# Patient Record
Sex: Male | Born: 1990 | Race: Black or African American | Hispanic: No | Marital: Single | Smoking: Never smoker
Health system: Southern US, Community
[De-identification: ages and names within clinical notes are randomized; demographics above are authoritative.]

## PROBLEM LIST (undated history)

## (undated) DIAGNOSIS — J45909 Unspecified asthma, uncomplicated: Secondary | ICD-10-CM

---

## 2013-03-20 ENCOUNTER — Emergency Department (HOSPITAL_COMMUNITY)
Admission: EM | Admit: 2013-03-20 | Discharge: 2013-03-20 | Disposition: A | Discharge status: Home / Self Care | Payer: Self-pay | Attending: Emergency Medicine | Admitting: Emergency Medicine

## 2013-03-20 ENCOUNTER — Emergency Department (HOSPITAL_COMMUNITY)
Admission: EM | Admit: 2013-03-20 | Discharge: 2013-03-20 | Disposition: A | Discharge status: Home / Self Care | Payer: Self-pay

## 2013-03-20 DIAGNOSIS — J45901 Unspecified asthma with (acute) exacerbation: Secondary | ICD-10-CM

## 2013-03-20 MED ORDER — ALBUTEROL SULFATE (5 MG/ML) 0.5% IN NEBU
5.0000 mg | INHALATION_SOLUTION | Freq: Once | RESPIRATORY_TRACT | Status: AC
  Administered 2013-03-20: 5 mg via RESPIRATORY_TRACT
  Filled 2013-03-20: qty 1

## 2013-03-20 MED ORDER — ALBUTEROL SULFATE (5 MG/ML) 0.5% IN NEBU
5.0000 mg | INHALATION_SOLUTION | Freq: Once | RESPIRATORY_TRACT | Status: AC
  Administered 2013-03-20: 5 mg via RESPIRATORY_TRACT

## 2013-03-20 MED ORDER — ALBUTEROL SULFATE (5 MG/ML) 0.5% IN NEBU
INHALATION_SOLUTION | RESPIRATORY_TRACT | Status: AC
  Filled 2013-03-20: qty 1

## 2013-03-20 MED ORDER — PREDNISONE 20 MG PO TABS: ORAL_TABLET | ORAL | Status: DC

## 2013-03-20 MED ORDER — PREDNISONE 20 MG PO TABS
60.0000 mg | ORAL_TABLET | Freq: Once | ORAL | Status: AC
  Administered 2013-03-20: 60 mg via ORAL
  Filled 2013-03-20: qty 3

## 2013-03-20 MED ORDER — ALBUTEROL SULFATE HFA 108 (90 BASE) MCG/ACT IN AERS: 2.0000 | INHALATION_SPRAY | RESPIRATORY_TRACT | Status: AC | PRN

## 2013-03-20 MED ORDER — IPRATROPIUM BROMIDE 0.02 % IN SOLN
0.5000 mg | Freq: Once | RESPIRATORY_TRACT | Status: AC
  Administered 2013-03-20: 0.5 mg via RESPIRATORY_TRACT
  Filled 2013-03-20: qty 2.5

## 2013-03-20 NOTE — ED Provider Notes (Signed)
CSN: [id number]     Arrival date & time 03/20/13  1844 History   First MD Initiated Contact with Patient 03/20/13 1848     Chief Complaint  Patient presents with  . Shortness of Breath   (Consider location/radiation/quality/duration/timing/severity/associated sxs/prior Treatment) HPI Comments: Patient is a 22 year old male with remote history of asthma who presents today with sudden onset shortness of breath. He reports that he has chest tightness. The pain is worse when he takes a deep breath in. He has not had an asthma attack since he was 22 years old, but this feels like that. He has no medications that he was able to use prior to arrival. He recently got back from [place] in late November. He has had a mild, nonproductive cough over the past few days, but states that it was nothing bad. No fever, chills, nausea, vomiting, abdominal pain, prior PE or DVT.  The history is provided by the patient. No language interpreter was used.    No past medical history on file. No past surgical history on file. No family history on file. History  Substance Use Topics  . Smoking status: Not on file  . Smokeless tobacco: Not on file  . Alcohol Use: Not on file    Review of Systems  Constitutional: Negative for fever and chills.  Respiratory: Positive for cough, chest tightness and shortness of breath.   Cardiovascular: Negative for leg swelling.  Gastrointestinal: Negative for nausea, vomiting and abdominal pain.  All other systems reviewed and are negative.    Allergies  Review of patient's allergies indicates no known allergies.  Home Medications  No current outpatient prescriptions on file. BP 133/91  Pulse 101  Temp(Src) 98.3 F (36.8 C) (Oral)  Resp 18  SpO2 95% Physical Exam  Nursing note and vitals reviewed. Constitutional: He is oriented to person, place, and time. He appears well-developed and well-nourished. No distress.  HENT:  Head: Normocephalic and atraumatic.  Right  Ear: External ear normal.  Left Ear: External ear normal.  Nose: Nose normal.  Eyes: Conjunctivae are normal.  Neck: Normal range of motion. No tracheal deviation present.  Cardiovascular: Normal rate, regular rhythm and normal heart sounds.   Pulmonary/Chest: Effort normal. No stridor. No respiratory distress. He has wheezes. He has no rhonchi. He has no rales.  Abdominal: Soft. He exhibits no distension. There is no tenderness.  Musculoskeletal: Normal range of motion.  Neurological: He is alert and oriented to person, place, and time.  Skin: Skin is warm and dry. He is not diaphoretic.  Psychiatric: He has a normal mood and affect. His behavior is normal.    ED Course  Procedures (including critical care time) Labs Review Labs Reviewed - No data to display Imaging Review No results found.  EKG Interpretation   None       MDM   1. Asthma attack    Patient ambulated in ED with O2 saturations maintained >90, no current signs of respiratory distress. Lung exam improved after nebulizer treatment. Prednisone given in the ED and pt will bd dc with 5 day burst. Pt states they are breathing at baseline. Pt has been instructed to continue using prescribed medications and to speak with PCP about today's exacerbation. Dr. Lynelle Doctor evaluated this patient and agrees with plan.     Mora Bellman, PA-C 03/20/13 [phone]

## 2013-03-20 NOTE — ED Notes (Signed)
Patient states that today he suddenly became hot and began to have chest tightness and shortness of breath. States he was recently discharged from the [employer] and has not had asthma attack since childhood, but the symptoms today are similar.

## 2013-03-21 NOTE — ED Provider Notes (Signed)
Medical screening examination/treatment/procedure(s) were performed by non-physician practitioner and as supervising physician I was immediately available for consultation/collaboration.    Celene Kras, MD 03/21/13 [phone]

## 2013-03-28 ENCOUNTER — Emergency Department (HOSPITAL_COMMUNITY)
Admission: EM | Admit: 2013-03-28 | Discharge: 2013-03-28 | Disposition: A | Discharge status: Home / Self Care | Payer: Self-pay | Attending: Emergency Medicine | Admitting: Emergency Medicine

## 2013-03-28 ENCOUNTER — Encounter (HOSPITAL_COMMUNITY): Payer: Self-pay | Admitting: Emergency Medicine

## 2013-03-28 ENCOUNTER — Emergency Department (HOSPITAL_COMMUNITY): Payer: Self-pay

## 2013-03-28 DIAGNOSIS — IMO0002 Reserved for concepts with insufficient information to code with codable children: Secondary | ICD-10-CM | POA: Insufficient documentation

## 2013-03-28 DIAGNOSIS — Z79899 Other long term (current) drug therapy: Secondary | ICD-10-CM | POA: Insufficient documentation

## 2013-03-28 DIAGNOSIS — R0789 Other chest pain: Secondary | ICD-10-CM | POA: Insufficient documentation

## 2013-03-28 DIAGNOSIS — J45901 Unspecified asthma with (acute) exacerbation: Secondary | ICD-10-CM | POA: Insufficient documentation

## 2013-03-28 HISTORY — DX: Unspecified asthma, uncomplicated: J45.909

## 2013-03-28 MED ORDER — PREDNISONE 20 MG PO TABS: ORAL_TABLET | ORAL | Status: DC

## 2013-03-28 MED ORDER — IPRATROPIUM BROMIDE 0.02 % IN SOLN
0.5000 mg | Freq: Once | RESPIRATORY_TRACT | Status: AC
  Administered 2013-03-28: 0.5 mg via RESPIRATORY_TRACT

## 2013-03-28 MED ORDER — PREDNISONE 20 MG PO TABS
60.0000 mg | ORAL_TABLET | Freq: Once | ORAL | Status: AC
  Administered 2013-03-28: 60 mg via ORAL
  Filled 2013-03-28: qty 3

## 2013-03-28 MED ORDER — ALBUTEROL SULFATE (5 MG/ML) 0.5% IN NEBU
2.5000 mg | INHALATION_SOLUTION | RESPIRATORY_TRACT | Status: DC | PRN
  Administered 2013-03-28: 2.5 mg via RESPIRATORY_TRACT
  Filled 2013-03-28: qty 0.5

## 2013-03-28 MED ORDER — ALBUTEROL SULFATE (5 MG/ML) 0.5% IN NEBU
2.5000 mg | INHALATION_SOLUTION | Freq: Once | RESPIRATORY_TRACT | Status: AC
  Administered 2013-03-28: 2.5 mg via RESPIRATORY_TRACT

## 2013-03-28 MED ORDER — ALBUTEROL SULFATE HFA 108 (90 BASE) MCG/ACT IN AERS: 2.0000 | INHALATION_SPRAY | RESPIRATORY_TRACT | Status: DC | PRN

## 2013-03-28 MED ORDER — IPRATROPIUM BROMIDE 0.02 % IN SOLN
0.5000 mg | RESPIRATORY_TRACT | Status: DC | PRN
  Administered 2013-03-28: 0.5 mg via RESPIRATORY_TRACT
  Filled 2013-03-28: qty 2.5

## 2013-03-28 NOTE — Progress Notes (Signed)
   CARE MANAGEMENT ED NOTE 03/28/2013  Patient:  Geoffrey Martinez, Geoffrey Martinez   Account Number:  [id number]  Date Initiated:  03/28/2013  Documentation initiated by:  Radford Pax  Subjective/Objective Assessment:   Patient presenst to Ed with asthma attack.  Paktient with audible wheezing.     Subjective/Objective Assessment Detail:   Patientgiven nebulizer in the ED.     Action/Plan:   Action/Plan Detail:   Anticipated DC Date:       Status Recommendation to Physician:   Result of Recommendation:    Other ED Services  Consult Working Plan    DC Planning Services  CM consult  Medication Assistance  MATCH Program  PCP issues  Other    Choice offered to / List presented to:            Status of service:  Completed, signed off  ED Comments:   ED Comments Detail:  EDCM spoke to aptient at bedside.  Patient reports he is homeless.  He says the address in the computer is his cousin's and admits to staying with his cousin but says that his cousin is threatening him to get out.  Patient is unemployed.  [facility] consulted for medication assistance. Patient without insurance.  MATCH program initiated. Patient to be discharged on prednisone and albuterol inhaler.  [facility] asked patient if he can afford six dollars. Patient replied that he could afford six dollars for his prescriptions.  [facility] provided patient with MATCH letter to provide to any of the listed [facility] participating pharmacies.  Explained to patient that he only has seven days to get RX filled.  Patient reports he was getting it filled tonight.  EDCM also explained to patient that [facility] would only be able to help him this once and would not be eligible for assistance for another year.  EDCM explained to patient that he needs to find a pcp.  EDCM offered to make patient an appointment at [facility] and patient agreed.  Also provided patient information to inquire about the orange card.  [facility] provided patient with a list of  pcps who acept self pay patients, RX discount card, list of discounted pharmacies, list of financial assistance in the community sucha s local churches and [employer], list of local shelters including information on the [facility] an [employer]. Patient thankful for information.  No further CM needs at this time.

## 2013-03-28 NOTE — ED Provider Notes (Signed)
CSN: [id number]     Arrival date & time 03/28/13  1805 History   First MD Initiated Contact with Patient 03/28/13 2041     Chief Complaint  Patient presents with  . Asthma Exacerbation    (Consider location/radiation/quality/duration/timing/severity/associated sxs/prior Treatment) HPI Comments: 22 yo male presents with recurrent "asthma attack". He states he was driving and immediately felt an onset of shortness of breath, wheezing, and cough. He also had associated chest tightness. This occurred about 4 hours ago. He went to the ER one is in the later received a DuoNeb which seemed to alleviate some of his symptoms but he still having some shortness of breath. Patient states that he had similar episode 8 days ago and was seen here and treated. He was given prednisone but did not fill the prescription due to money issues. He recently returned from [place] where he was in the [employer] about one month ago. He had asthma as a child has not had any symptoms since returning. Denies a fevers, chills, rhinorrhea, or productive cough. No hemoptysis.   Past Medical History  Diagnosis Date  . Asthma    No past surgical history on file. No family history on file. History  Substance Use Topics  . Smoking status: Not on file  . Smokeless tobacco: Not on file  . Alcohol Use: Not on file    Review of Systems  Constitutional: Negative for fever and chills.  Respiratory: Positive for cough, chest tightness, shortness of breath and wheezing.   Cardiovascular: Negative for leg swelling.  Gastrointestinal: Negative for vomiting.  All other systems reviewed and are negative.    Allergies  Review of patient's allergies indicates no known allergies.  Home Medications   Current Outpatient Rx  Name  Route  Sig  Dispense  Refill  . albuterol (PROVENTIL HFA;VENTOLIN HFA) 108 (90 BASE) MCG/ACT inhaler   Inhalation   Inhale 2 puffs into the lungs every 4 (four) hours as needed for wheezing or shortness  of breath.   1 Inhaler   3   . predniSONE (DELTASONE) 20 MG tablet      3 tabs po day one, then 2 po daily x 4 days   11 tablet   0    BP 129/76  Pulse 106  Resp 20  SpO2 98% Physical Exam  Nursing note and vitals reviewed. Constitutional: He is oriented to person, place, and time. He appears well-developed and well-nourished. No distress.  HENT:  Head: Normocephalic and atraumatic.  Right Ear: External ear normal.  Left Ear: External ear normal.  Nose: Nose normal.  Eyes: Right eye exhibits no discharge. Left eye exhibits no discharge.  Neck: Neck supple.  Cardiovascular: Normal rate, regular rhythm, normal heart sounds and intact distal pulses.   Pulmonary/Chest: Effort normal. No respiratory distress. He has wheezes.  Abdominal: Soft. There is no tenderness.  Musculoskeletal: He exhibits no edema.  Neurological: He is alert and oriented to person, place, and time.  Skin: Skin is warm and dry.    ED Course  Procedures (including critical care time) Labs Review Labs Reviewed - No data to display Imaging Review Dg Chest 2 View  03/28/2013   CLINICAL DATA:  Shortness of breath, chest tightness  EXAM: CHEST  2 VIEW  COMPARISON:  None.  FINDINGS: Normal cardiac silhouette and mediastinal contours. The lungs appear mildly hyperexpanded with mild diffuse slightly nodular thickening of the pulmonary interstitium. There is minimal pleural parenchymal thickening above the bilateral major fissures. No focal airspace  opacities. No pleural effusion pneumothorax. No evidence of edema. No acute osseus abnormalities.  IMPRESSION: Findings suggestive of airways disease / bronchitis. No focal airspace opacities to suggest pneumonia.   Electronically Signed   By: Simonne Come M.D.   On: 03/28/2013 21:18    EKG Interpretation   None       MDM   1. Asthma exacerbation    Patient appears well in all the symptoms resolved after a few breathing treatments. Patient was also given  steroids. Had case management see the patient given he states he has no money to buy prednisone.They saw him and gave him assistance and he will go pick up his steroid and albuterol prescriptions. He does have one albuterol inhaler from when he was here. No obvious pneumonia on the x-ray his symptoms are consistent with reactive airway disease. I encouraged him to get a very careful positioning the fall but the ED if any symptoms return or worsen.    Audree Camel, MD 03/28/13 [phone]

## 2013-03-28 NOTE — ED Notes (Signed)
Pt reports feeling much better after breathing treatment

## 2013-03-28 NOTE — ED Notes (Signed)
Pt states he is having an asthma attack. Pt whispering. Audible wheezes present. Pt taken to room 2 in triage and nebulizer tx given with albuterol and atrovent.

## 2013-05-25 ENCOUNTER — Encounter (HOSPITAL_COMMUNITY): Payer: Self-pay | Admitting: Emergency Medicine

## 2013-05-25 ENCOUNTER — Emergency Department (HOSPITAL_COMMUNITY): Payer: Self-pay

## 2013-05-25 DIAGNOSIS — J45901 Unspecified asthma with (acute) exacerbation: Secondary | ICD-10-CM | POA: Insufficient documentation

## 2013-05-25 DIAGNOSIS — Z79899 Other long term (current) drug therapy: Secondary | ICD-10-CM | POA: Insufficient documentation

## 2013-05-25 DIAGNOSIS — R000 Tachycardia, unspecified: Secondary | ICD-10-CM | POA: Insufficient documentation

## 2013-05-25 MED ORDER — ALBUTEROL SULFATE (2.5 MG/3ML) 0.083% IN NEBU
5.0000 mg | INHALATION_SOLUTION | Freq: Once | RESPIRATORY_TRACT | Status: AC
  Administered 2013-05-25: 5 mg via RESPIRATORY_TRACT
  Filled 2013-05-25: qty 6

## 2013-05-25 NOTE — ED Notes (Signed)
Pt. reports asthma attack onset this evening with occasional productive cough , pt. ran out of his MDI . Denies fever.

## 2013-05-26 ENCOUNTER — Emergency Department (HOSPITAL_COMMUNITY)
Admission: EM | Admit: 2013-05-26 | Discharge: 2013-05-26 | Disposition: A | Discharge status: Home / Self Care | Payer: Self-pay | Attending: Emergency Medicine | Admitting: Emergency Medicine

## 2013-05-26 DIAGNOSIS — J45901 Unspecified asthma with (acute) exacerbation: Secondary | ICD-10-CM

## 2013-05-26 MED ORDER — ALBUTEROL SULFATE HFA 108 (90 BASE) MCG/ACT IN AERS
2.0000 | INHALATION_SPRAY | Freq: Once | RESPIRATORY_TRACT | Status: AC
  Administered 2013-05-26: 2 via RESPIRATORY_TRACT
  Filled 2013-05-26: qty 6.7

## 2013-05-26 MED ORDER — CETIRIZINE HCL 5 MG/5ML PO SYRP
10.0000 mg | ORAL_SOLUTION | Freq: Once | ORAL | Status: AC
  Administered 2013-05-26: 10 mg via ORAL
  Filled 2013-05-26: qty 10

## 2013-05-26 MED ORDER — PREDNISONE 20 MG PO TABS: 40.0000 mg | ORAL_TABLET | Freq: Every day | ORAL | Status: AC

## 2013-05-26 MED ORDER — ALBUTEROL (5 MG/ML) CONTINUOUS INHALATION SOLN
10.0000 mg/h | INHALATION_SOLUTION | RESPIRATORY_TRACT | Status: AC
  Administered 2013-05-26: 10 mg/h via RESPIRATORY_TRACT
  Filled 2013-05-26: qty 20

## 2013-05-26 MED ORDER — METHYLPREDNISOLONE SODIUM SUCC 125 MG IJ SOLR
125.0000 mg | Freq: Once | INTRAMUSCULAR | Status: AC
Start: 2013-05-26 — End: 2013-05-26
  Administered 2013-05-26: 125 mg via INTRAVENOUS
  Filled 2013-05-26: qty 2

## 2013-05-26 MED ORDER — IPRATROPIUM BROMIDE 0.02 % IN SOLN
1.0000 mg | Freq: Once | RESPIRATORY_TRACT | Status: AC
  Administered 2013-05-26: 1 mg via RESPIRATORY_TRACT
  Filled 2013-05-26: qty 5

## 2013-05-26 MED ORDER — AEROCHAMBER PLUS W/MASK MISC
1.0000 | Freq: Once | Status: AC
  Administered 2013-05-26: 1
  Filled 2013-05-26: qty 1

## 2013-05-26 NOTE — ED Provider Notes (Signed)
Medical screening examination/treatment/procedure(s) were performed by non-physician practitioner and as supervising physician I was immediately available for consultation/collaboration.    Olivia Mackielga M Kathaleen Dudziak, MD 05/26/13 [phone]

## 2013-05-26 NOTE — ED Notes (Signed)
Pt sts he is feeling better.  No expiratory nor inspiratory wheezing heard.

## 2013-05-26 NOTE — ED Provider Notes (Signed)
Medical screening examination/treatment/procedure(s) were performed by non-physician practitioner and as supervising physician I was immediately available for consultation/collaboration.    Akeria Hedstrom M Dyshon Philbin, MD 05/26/13 0306 

## 2013-05-26 NOTE — ED Provider Notes (Signed)
CSN: [id number]     Arrival date & time 05/25/13  2205 History   First MD Initiated Contact with Patient 05/26/13 0035     Chief Complaint  Patient presents with  . Asthma     (Consider location/radiation/quality/duration/timing/severity/associated sxs/prior Treatment) Patient is a 23 y.o. male presenting with asthma. The history is provided by the patient and medical records. No language interpreter was used.  Asthma Pertinent negatives include no abdominal pain, chest pain, coughing, diaphoresis, fatigue, fever, headaches, nausea, rash or vomiting.    Geoffrey Martinez is a 23 y.o. male  with a hx of asthma presents to the Emergency Department complaining of acute, now resolved "asthma attack" onset several hours prior to arrival.  Pt reports he ran out of his albuterol MDI.  Pt reports he used the entire thing in 2 days.  He does not have a PCP since he moved back from [place].  Associated symptoms include chest tightness, SOB and wheezing.   Albuterol makes it better for only a short period and exertion makes it worse.  Pt denies fever, chills, headache, neck pain, chest pain, abd pain, N/V/D, weakness, dizziness, syncope.  Pt reports no hx of hospitalization or intubation due to this.      Past Medical History  Diagnosis Date  . Asthma    History reviewed. No pertinent past surgical history. No family history on file. History  Substance Use Topics  . Smoking status: Never Smoker   . Smokeless tobacco: Not on file  . Alcohol Use: Yes    Review of Systems  Constitutional: Negative for fever, diaphoresis, appetite change, fatigue and unexpected weight change.  HENT: Negative for mouth sores.   Eyes: Negative for visual disturbance.  Respiratory: Positive for chest tightness, shortness of breath and wheezing. Negative for cough.   Cardiovascular: Negative for chest pain.  Gastrointestinal: Negative for nausea, vomiting, abdominal pain, diarrhea and constipation.   Endocrine: Negative for polydipsia, polyphagia and polyuria.  Genitourinary: Negative for dysuria, urgency, frequency and hematuria.  Musculoskeletal: Negative for back pain and neck stiffness.  Skin: Negative for rash.  Allergic/Immunologic: Negative for immunocompromised state.  Neurological: Negative for syncope, light-headedness and headaches.  Hematological: Does not bruise/bleed easily.  Psychiatric/Behavioral: Negative for sleep disturbance. The patient is not nervous/anxious.       Allergies  Review of patient's allergies indicates no known allergies.  Home Medications   Current Outpatient Rx  Name  Route  Sig  Dispense  Refill  . albuterol (PROVENTIL HFA;VENTOLIN HFA) 108 (90 BASE) MCG/ACT inhaler   Inhalation   Inhale 2 puffs into the lungs every 4 (four) hours as needed for wheezing or shortness of breath.   1 Inhaler   3   . predniSONE (DELTASONE) 20 MG tablet   Oral   Take 20 mg by mouth See admin instructions. 3 tabs po day one, then 2 po daily x 4 days. Finished three weeks ago from 05-25-13         . predniSONE (DELTASONE) 20 MG tablet   Oral   Take 2 tablets (40 mg total) by mouth daily.   10 tablet   0    BP 132/90  Pulse 118  Temp(Src) 98.6 F (37 C) (Oral)  Resp 22  Wt 227 lb 3 oz (103.052 kg)  SpO2 94% Physical Exam  Nursing note and vitals reviewed. Constitutional: He is oriented to person, place, and time. He appears well-developed and well-nourished. No distress.  Awake, alert, nontoxic appearance  HENT:  Head: Normocephalic and atraumatic.  Right Ear: Tympanic membrane, external ear and ear canal normal.  Left Ear: Tympanic membrane, external ear and ear canal normal.  Nose: No mucosal edema or rhinorrhea. No epistaxis. Right sinus exhibits no maxillary sinus tenderness and no frontal sinus tenderness. Left sinus exhibits no maxillary sinus tenderness and no frontal sinus tenderness.  Mouth/Throat: Uvula is midline, oropharynx is clear  and moist and mucous membranes are normal. Mucous membranes are not pale and not cyanotic. No oropharyngeal exudate, posterior oropharyngeal edema, posterior oropharyngeal erythema or tonsillar abscesses.  Eyes: Conjunctivae are normal. Pupils are equal, round, and reactive to light. No scleral icterus.  Neck: Normal range of motion and full passive range of motion without pain. Neck supple.  Cardiovascular: Regular rhythm, normal heart sounds and intact distal pulses.   Tachycardia  Pulmonary/Chest: Effort normal. No accessory muscle usage or stridor. Not tachypneic. No respiratory distress. He has decreased breath sounds (throughout). He has wheezes (inspiratory and expiratory throughout). He has no rhonchi. He has no rales.  Patient with significantly decreased breath sounds and inspiratory and expiratory wheezes throughout No Rales or rhonchi  Abdominal: Soft. Bowel sounds are normal. He exhibits no distension and no mass. There is no tenderness. There is no rebound and no guarding.  Musculoskeletal: Normal range of motion. He exhibits no edema.  Lymphadenopathy:    He has no cervical adenopathy.  Neurological: He is alert and oriented to person, place, and time. He exhibits normal muscle tone. Coordination normal.  Speech is clear and goal oriented Moves extremities without ataxia  Skin: Skin is warm and dry. No rash noted. He is not diaphoretic. No erythema.  Psychiatric: He has a normal mood and affect.    ED Course  Procedures (including critical care time) Labs Review Labs Reviewed - No data to display Imaging Review Dg Chest 2 View  05/25/2013   CLINICAL DATA:  Asthma  EXAM: CHEST  2 VIEW  COMPARISON:  March 28, 2013  FINDINGS: The heart size and mediastinal contours are within normal limits. Both lungs are clear. The visualized skeletal structures are unremarkable.  IMPRESSION: No active cardiopulmonary disease.   Electronically Signed   By: Sherian ReinWei-Chen  Lin M.D.   On: 05/25/2013  23:14    EKG Interpretation   None       MDM   Final diagnoses:  Asthma exacerbation   Geoffrey Martinez presents with c/o asthma attack and needing a refill on his MDI.  CXR without evidence of consolidation, pneumonia, pneumothorax or pulmonary edema.    Will give 1 hour continuous neb, zyrtec and solumedrol.  Discussed with Earley FavorGail Schulz, NP who will reassess after treatment and dispo accordingly.    Pt tachycardic, but still with increased work of breathing and after initial nebulizer. No hypoxia or tachypnea.    BP 132/90  Pulse 118  Temp(Src) 98.6 F (37 C) (Oral)  Resp 22  Wt 227 lb 3 oz (103.052 kg)  SpO2 94%   Dierdre ForthHannah Torrell Krutz, PA-C 05/26/13 0113

## 2013-05-26 NOTE — Discharge Instructions (Signed)
1. Medications: albuterol inhaler, prednisone, usual home medications 2. Treatment: rest, drink plenty of fluids,  3. Follow Up: Please followup with your primary doctor for discussion of your diagnoses and further evaluation after today's visit; if you do not have a primary care doctor use the resource guide provided to find one;    Asthma, Adult Asthma is a recurring condition in which the airways tighten and narrow. Asthma can make it difficult to breathe. It can cause coughing, wheezing, and shortness of breath. Asthma episodes (also called asthma attacks) range from minor to life-threatening. Asthma cannot be cured, but medicines and lifestyle changes can help control it. CAUSES Asthma is believed to be caused by inherited (genetic) and environmental factors, but its exact cause is unknown. Asthma may be triggered by allergens, lung infections, or irritants in the air. Asthma triggers are different for each person. Common triggers include:   Animal dander.  Dust mites.  Cockroaches.  Pollen from trees or grass.  Mold.  Smoke.  Air pollutants such as dust, household cleaners, hair sprays, aerosol sprays, paint fumes, strong chemicals, or strong odors.  Cold air, weather changes, and winds (which increase molds and pollens in the air).  Strong emotional expressions such as crying or laughing hard.  Stress.  Certain medicines (such as aspirin) or types of drugs (such as beta-blockers).  Sulfites in foods and drinks. Foods and drinks that may contain sulfites include dried fruit, potato chips, and sparkling grape juice.  Infections or inflammatory conditions such as the flu, a cold, or an inflammation of the nasal membranes (rhinitis).  Gastroesophageal reflux disease (GERD).  Exercise or strenuous activity. SYMPTOMS Symptoms may occur immediately after asthma is triggered or many hours later. Symptoms include:  Wheezing.  Excessive nighttime or early morning  coughing.  Frequent or severe coughing with a common cold.  Chest tightness.  Shortness of breath. DIAGNOSIS  The diagnosis of asthma is made by a review of your medical history and a physical exam. Tests may also be performed. These may include:  Lung function studies. These tests show how much air you breath in and out.  Allergy tests.  Imaging tests such as X-rays. TREATMENT  Asthma cannot be cured, but it can usually be controlled. Treatment involves identifying and avoiding your asthma triggers. It also involves medicines. There are 2 classes of medicine used for asthma treatment:   Controller medicines. These prevent asthma symptoms from occurring. They are usually taken every day.  Reliever or rescue medicines. These quickly relieve asthma symptoms. They are used as needed and provide short-term relief. Your health care provider will help you create an asthma action plan. An asthma action plan is a written plan for managing and treating your asthma attacks. It includes a list of your asthma triggers and how they may be avoided. It also includes information on when medicines should be taken and when their dosage should be changed. An action plan may also involve the use of a device called a peak flow meter. A peak flow meter measures how well the lungs are working. It helps you monitor your condition. HOME CARE INSTRUCTIONS   Take medicine as directed by your health care provider. Speak with your health care provider if you have questions about how or when to take the medicines.  Use a peak flow meter as directed by your health care provider. Record and keep track of readings.  Understand and use the action plan to help minimize or stop an asthma attack without  needing to seek medical care.  Control your home environment in the following ways to help prevent asthma attacks:  Do not smoke. Avoid being exposed to secondhand smoke.  Change your heating and air conditioning filter  regularly.  Limit your use of fireplaces and wood stoves.  Get rid of pests (such as roaches and mice) and their droppings.  Throw away plants if you see mold on them.  Clean your floors and dust regularly. Use unscented cleaning products.  Try to have someone else vacuum for you regularly. Stay out of rooms while they are being vacuumed and for a short while afterward. If you vacuum, use a dust mask from a hardware store, a double-layered or microfilter vacuum cleaner bag, or a vacuum cleaner with a HEPA filter.  Replace carpet with wood, tile, or vinyl flooring. Carpet can trap dander and dust.  Use allergy-proof pillows, mattress covers, and box spring covers.  Wash bed sheets and blankets every week in hot water and dry them in a dryer.  Use blankets that are made of polyester or cotton.  Clean bathrooms and kitchens with bleach. If possible, have someone repaint the walls in these rooms with mold-resistant paint. Keep out of the rooms that are being cleaned and painted.  Wash hands frequently. SEEK MEDICAL CARE IF:   You have wheezing, shortness of breath, or a cough even if taking medicine to prevent attacks.  The colored mucus you cough up (sputum) is thicker than usual.  Your sputum changes from clear or white to yellow, green, gray, or bloody.  You have any problems that may be related to the medicines you are taking (such as a rash, itching, swelling, or trouble breathing).  You are using a reliever medicine more than 2 3 times per week.  Your peak flow is still at 50 79% of you personal best after following your action plan for 1 hour. SEEK IMMEDIATE MEDICAL CARE IF:   You seem to be getting worse and are unresponsive to treatment during an asthma attack.  You are short of breath even at rest.  You get short of breath when doing very little physical activity.  You have difficulty eating, drinking, or talking due to asthma symptoms.  You develop chest  pain.  You develop a fast heartbeat.  You have a bluish color to your lips or fingernails.  You are lightheaded, dizzy, or faint.  Your peak flow is less than 50% of your personal best.  You have a fever or persistent symptoms for more than 2 3 days.  You have a fever and symptoms suddenly get worse. MAKE SURE YOU:   Understand these instructions.  Will watch your condition.  Will get help right away if you are not doing well or get worse. Document Released: 03/31/2005 Document Revised: 12/01/2012 Document Reviewed: 10/28/2012 [facility] Patient Information 2014 [place], [place].    Asthma Attack Prevention Although there is no way to prevent asthma from starting, you can take steps to control the disease and reduce its symptoms. Learn about your asthma and how to control it. Take an active role to control your asthma by working with your health care provider to create and follow an asthma action plan. An asthma action plan guides you in:  Taking your medicines properly.  Avoiding things that set off your asthma or make your asthma worse (asthma triggers).  Tracking your level of asthma control.  Responding to worsening asthma.  Seeking emergency care when needed. To track your asthma,  keep records of your symptoms, check your peak flow number using a handheld device that shows how well air moves out of your lungs (peak flow meter), and get regular asthma checkups.  WHAT ARE SOME WAYS TO PREVENT AN ASTHMA ATTACK?  Take medicines as directed by your health care provider.  Keep track of your asthma symptoms and level of control.  With your health care provider, write a detailed plan for taking medicines and managing an asthma attack. Then be sure to follow your action plan. Asthma is an ongoing condition that needs regular monitoring and treatment.  Identify and avoid asthma triggers. Many outdoor allergens and irritants (such as pollen, mold, cold air, and air pollution) can  trigger asthma attacks. Find out what your asthma triggers are and take steps to avoid them.  Monitor your breathing. Learn to recognize warning signs of an attack, such as coughing, wheezing, or shortness of breath. Your lung function may decrease before you notice any signs or symptoms, so regularly measure and record your peak airflow with a home peak flow meter.  Identify and treat attacks early. If you act quickly, you are less likely to have a severe attack. You will also need less medicine to control your symptoms. When your peak flow measurements decrease and alert you to an upcoming attack, take your medicine as instructed and immediately stop any activity that may have triggered the attack. If your symptoms do not improve, get medical help.  Pay attention to increasing quick-relief inhaler use. If you find yourself relying on your quick-relief inhaler, your asthma is not under control. See your health care provider about adjusting your treatment. WHAT CAN MAKE MY SYMPTOMS WORSE? A number of common things can set off or make your asthma symptoms worse and cause temporary increased inflammation of your airways. Keep track of your asthma symptoms for several weeks, detailing all the environmental and emotional factors that are linked with your asthma. When you have an asthma attack, go back to your asthma diary to see which factor, or combination of factors, might have contributed to it. Once you know what these factors are, you can take steps to control many of them. If you have allergies and asthma, it is important to take asthma prevention steps at home. Minimizing contact with the substance to which you are allergic will help prevent an asthma attack. Some triggers and ways to avoid these triggers are: Animal Dander:  Some people are allergic to the flakes of skin or dried saliva from animals with fur or feathers.   There is no such thing as a hypoallergenic dog or cat breed. All dogs or cats  can cause allergies, even if they don't shed.  Keep these pets out of your home.  If you are not able to keep a pet outdoors, keep the pet out of your bedroom and other sleeping areas at all times, and keep the door closed.  Remove carpets and furniture covered with cloth from your home. If that is not possible, keep the pet away from fabric-covered furniture and carpets. Dust Mites: Many people with asthma are allergic to dust mites. Dust mites are tiny bugs that are found in every home in mattresses, pillows, carpets, fabric-covered furniture, bedcovers, clothes, stuffed toys, and other fabric-covered items.   Cover your mattress in a special dust-proof cover.  Cover your pillow in a special dust-proof cover, or wash the pillow each week in hot water. Water must be hotter than 130 F (54.4 C) to  kill dust mites. Cold or warm water used with detergent and bleach can also be effective.  Wash the sheets and blankets on your bed each week in hot water.  Try not to sleep or lie on cloth-covered cushions.  Call ahead when traveling and ask for a smoke-free hotel room. Bring your own bedding and pillows in case the hotel only supplies feather pillows and down comforters, which may contain dust mites and cause asthma symptoms.  Remove carpets from your bedroom and those laid on concrete, if you can.  Keep stuffed toys out of the bed, or wash the toys weekly in hot water or cooler water with detergent and bleach. Cockroaches: Many people with asthma are allergic to the droppings and remains of cockroaches.   Keep food and garbage in closed containers. Never leave food out.  Use poison baits, traps, powders, gels, or paste (for example, boric acid).  If a spray is used to kill cockroaches, stay out of the room until the odor goes away. Indoor Mold:  Fix leaky faucets, pipes, or other sources of water that have mold around them.  Clean floors and moldy surfaces with a fungicide or diluted  bleach.  Avoid using humidifiers, vaporizers, or swamp coolers. These can spread molds through the air. Pollen and Outdoor Mold:  When pollen or mold spore counts are high, try to keep your windows closed.  Stay indoors with windows closed from late morning to afternoon. Pollen and some mold spore counts are highest at that time.  Ask your health care provider whether you need to take anti-inflammatory medicine or increase your dose of the medicine before your allergy season starts. Other Irritants to Avoid:  Tobacco smoke is an irritant. If you smoke, ask your health care provider how you can quit. Ask family members to quit smoking too. Do not allow smoking in your home or car.  If possible, do not use a wood-burning stove, kerosene heater, or fireplace. Minimize exposure to all sources of smoke, including to incense, candles, fires, and fireworks.  Try to stay away from strong odors and sprays, such as perfume, talcum powder, hair spray, and paints.  Decrease humidity in your home and use an indoor air cleaning device. Reduce indoor humidity to below 60%. Dehumidifiers or central air conditioners can do this.  Decrease house dust exposure by changing furnace and air cooler filters frequently.  Try to have someone else vacuum for you once or twice a week. Stay out of rooms while they are being vacuumed and for a short while afterward.  If you vacuum, use a dust mask from a hardware store, a double-layered or microfilter vacuum cleaner bag, or a vacuum cleaner with a HEPA filter.  Sulfites in foods and beverages can be irritants. Do not drink beer or wine or eat dried fruit, processed potatoes, or shrimp if they cause asthma symptoms.  Cold air can trigger an asthma attack. Cover your nose and mouth with a scarf on cold or windy days.  Several health conditions can make asthma more difficult to manage, including a runny nose, sinus infections, reflux disease, psychological stress, and  sleep apnea. Work with your health care provider to manage these conditions.  Avoid close contact with people who have a respiratory infection such as a cold or the flu, since your asthma symptoms may get worse if you catch the infection. Wash your hands thoroughly after touching items that may have been handled by people with a respiratory infection.  Get a  flu shot every year to protect against the flu virus, which often makes asthma worse for days or weeks. Also get a pneumonia shot if you have not previously had one. Unlike the flu shot, the pneumonia shot does not need to be given yearly. Medicines:  Talk to your health care provider about whether it is safe for you to take aspirin or non-steroidal anti-inflammatory medicines (NSAIDs). In a small number of people with asthma, aspirin and NSAIDs can cause asthma attacks. These medicines must be avoided by people who have known aspirin-sensitive asthma. It is important that people with aspirin-sensitive asthma read labels of all over-the-counter medicines used to treat pain, colds, coughs, and fever.  Beta blockers and ACE inhibitors are other medicines you should discuss with your health care provider. HOW CAN I FIND OUT WHAT I AM ALLERGIC TO? Ask your asthma health care provider about allergy skin testing or blood testing (the RAST test) to identify the allergens to which you are sensitive. If you are found to have allergies, the most important thing to do is to try to avoid exposure to any allergens that you are sensitive to as much as possible. Other treatments for allergies, such as medicines and allergy shots (immunotherapy) are available.  CAN I EXERCISE? Follow your health care provider's advice regarding asthma treatment before exercising. It is important to maintain a regular exercise program, but vigorous exercise, or exercise in cold, humid, or dry environments can cause asthma attacks, especially for those people who have  exercise-induced asthma. Document Released: 03/19/2009 Document Revised: 12/01/2012 Document Reviewed: 10/06/2012 [facility] Patient Information 2014 [place], [place].    Emergency Department Resource Guide 1) Find a Doctor and Pay Out of Pocket Although you won't have to find out who is covered by your insurance plan, it is a good idea to ask around and get recommendations. You will then need to call the office and see if the doctor you have chosen will accept you as a new patient and what types of options they offer for patients who are self-pay. Some doctors offer discounts or will set up payment plans for their patients who do not have insurance, but you will need to ask so you aren't surprised when you get to your appointment.  2) Contact Your Local Health Department Not all health departments have doctors that can see patients for sick visits, but many do, so it is worth a call to see if yours does. If you don't know where your local health department is, you can check in your phone book. The CDC also has a tool to help you locate your state's health department, and many state websites also have listings of all of their local health departments.  3) Find a Walk-in Clinic If your illness is not likely to be very severe or complicated, you may want to try a walk in clinic. These are popping up all over the country in pharmacies, drugstores, and shopping centers. They're usually staffed by nurse practitioners or physician assistants that have been trained to treat common illnesses and complaints. They're usually fairly quick and inexpensive. However, if you have serious medical issues or chronic medical problems, these are probably not your best option.  No Primary Care Doctor: - Call Health Connect at  [phone] - they can help you locate a primary care doctor that  accepts your insurance, provides certain services, etc. - Physician Referral Service- [phone]  Chronic Pain  Problems: Organization         Address  Phone   Notes  [facility]  [phone] Patients need to be referred by their primary care doctor.   Medication Assistance: Organization         Address  Phone   Notes  [facility] Medication [facility] [address] [place] [place], [place] [address] [phone] --Must be a resident of [facility] -- Must have NO insurance coverage whatsoever (no Medicaid/ Medicare, etc.) -- The pt. MUST have a primary care doctor that directs their care regularly and follows them in the community   [facility]  [phone]   [employer]  [phone]    Agencies that provide inexpensive medical care: Organization         Address  Phone   Notes  [facility]  [phone]   [facility]    [phone]   [facility] [address] [place], [place] [address] [phone]   [facility] [address] [place] [place] [phone]   Planned Parenthood    [phone]   [facility]    [phone]   [facility]  [address], [place] Phone:  [phone], Fax:  [phone] Hours of Operation:  9 am - 6 pm, M-F.  Also accepts Medicaid/Medicare and self-pay.  [facility]  [address], [place] Phone: [phone], Fax: [phone]. Hours of Operation:  8:30 am - 5:30 pm, M-F.  Also accepts Medicaid and self-pay.  [facility] [place] [address], [place] Phone: [phone]   [facility] [address] Natasha Bence [place], [place] [phone], Ext. [phone] Mondays & Thursdays: 7-9 AM.  First 15 patients are seen on a first come, first serve basis.    Medicaid-accepting [facility] Providers:  Organization         Address  Phone   Notes  [facility] [address], [place] [phone] Also  accepts self-pay patients.  [facility] [address] Laurell Josephs [place], [place]  [phone]   [facility] [address], [place] [phone]   [facility] [address], [place] [phone]   Renaye Rakers [address], [place]   [phone] Only accepts [place] patients after they have their name applied to their card.   Self-Pay (no insurance) in [facility]:  Organization         Address  Phone   Notes  Sickle Cell Patients, [facility] [address] [place], [place] [phone]   [facility] [address] [place], [place] [phone]   [facility]  [address], [place] [phone]   [facility]/Dr. Osei-Bonsu  [address] [place] or [address], [place] [phone] Phone number for both [place] and [place] locations is the same.  [facility] [address], [place] [phone]   [facility] [address], [place] or [address] [phone]   [facility] [address], [place] [phone], phone; [phone], fax Sees patients 1st and 3rd Saturday of every month.  Must not qualify for public or  private insurance (i.e. Medicaid, Medicare, [place] Health Choice, Veterans' Benefits)  Household income should be no more than 200% of the poverty level The clinic cannot treat you if you are pregnant or think you are pregnant  Sexually transmitted diseases are not treated at the clinic.    Dental Care: Organization         Address  Phone  Notes  [facility] [address], [place] [phone] Accepts children up to age 23 who are enrolled in [place] or [place] Health Choice; pregnant  women with a Medicaid card; and children who have applied for Medicaid or [place] Health Choice, but were declined, whose parents can pay a reduced fee at time of service.  [facility] [place]  [address], [place] [phone] Accepts children up to age 23 who are enrolled in [place] or [place] Health Choice; pregnant women with a Medicaid card; and children who have applied for Medicaid or [place] Health Choice, but were declined, whose parents can pay a reduced fee at time of service.  [facility]  [address], [place] [phone] Patients are seen by appointment only. Walk-ins are not accepted. [facility] will see patients 23 years of age and older. Monday - Tuesday (8am-5pm) Most Wednesdays (8:30-5pm) $30 per visit, cash only  [facility]  [address], [place] [phone] Patients are seen by appointment only. Walk-ins are not accepted. [facility] will see patients 718 years of age and older. One Wednesday Evening (Monthly: Volunteer Based).  $30 per visit, cash only  [employer] [facility] [facility]  [phone] for adults; Children under age 804, call Graduate Pediatric Dentistry at [phone]. Children aged 544-14, please call [phone] to request a pediatric application.  Dental services are provided in all areas of dental care including fillings, crowns and bridges, complete and partial dentures, implants, gum treatment, root canals, and extractions. Preventive care is also provided. Treatment is provided to both adults and children. Patients are selected via a lottery and there is often a waiting list.   [facility] [address], [place]  [phone] [email or web address]   [facility] [address], [place], [place] [phone], Ext. [phone] Second and Fourth Thursday of each month, opens at 6:30 AM; Clinic ends at 9 AM.  Patients are  seen on a first-come first-served basis, and a limited number are seen during each clinic.   [facility]  [address], [place], [place] [phone]   Eligibility Requirements You must have lived in [place], [place], or [place] for at least the last three months.   You cannot be eligible for state or federal sponsored [place] insurance, including [employer], [place], or [employer]icare.   You generally cannot be eligible for healthcare insurance through your employer.    How to apply: Eligibility screenings are held every Tuesday and Wednesday afternoon from 1:00 pm until 4:00 pm. You do not need an appointment for the interview!  [facility] [address], [place], [place] [phone]   [facility]  [phone]   [facility]  [phone]   [facility]  [phone]    Behavioral Health Resources in the Community: Intensive Outpatient Programs Organization         Address  Phone  Notes  [facility] [address] [place], [place] [phone]   [facility] Outpatient [address] [place], [place] [phone]   [facility] [address], [place], [place]  [phone]   [facility] [address]  [place], [place] [phone] or [phone]   Substance Abuse Resources Organization         Address  Phone  Notes  [facility]  [phone]   [facility]  [phone]   [facility] [place]  [phone]   Floydene Flock  [phone]   Residential & Outpatient Substance Abuse Program  [phone]   Psychological Services Organization         Address  Phone  Notes  [facility]  [phone]   [facility]  [phone]   [facility] [address], [place] [phone] or [phone]    Mobile Crisis  Teams Organization         Address  Phone  Notes  [facility], Mobile Crisis Care Unit  [phone]   [facility]  [address] [place], [place] [phone]   Doristine Locks [address] [place] [phone]    Self-Help/Support Groups Organization         Address  Phone             Notes  [facility] [place] - variety of support groups  336- I7437963 Call for more information  Narcotics Anonymous (NA), [facility] [address], [employer] [place]  2 meetings at this location   Statistician         Address  Phone  Notes  [facility] [address],    [place]  [phone]   [facility]  [address] [place] [address], [place], [place] [phone]   [facility] [address] [place], [place] [phone] Admissions: 8am-3pm M-F  [facility] [address]    [place], [place] [phone]   [facility] [address] [place], [place], [place] [phone]   [facility] [address],  [place], [place] [phone]   [facility] - Intensive Outpatient [address], [place], [place] [phone]   [facility] [address]  [place], [place] [phone] or [phone]   [facility] ([facility] [address] [place], [place] [phone] Accepts Medicaid  [facility] [address],  [place] [phone] Substance Abuse/Addiction Treatment   [facility] Organization         Address  Phone  Notes  [facility]  [phone]   Angie Fava, PhD [address] [place], [place]   [phone] or [phone]   [facility] Behavioral   [address] [place], [place] [phone]   [facility] [address], [place], [place] [phone]  Insurance/Medicaid/sponsorship through [facility] [address]                                    [place], [place] [phone] Therapy/tele-psych/case  [facility] [address], [place] [phone]    Dr. Lolly Mustache  [phone]   [facility]  [facility] [place]  [facility] Dept. 1) [address] [place] 2) [place] 3)  [place] [address], [place] [phone]  [phone]   [place] [phone] or [phone] (After Hours)

## 2013-05-26 NOTE — ED Notes (Signed)
Pt ambulated to restroom with no difficulty.

## 2013-05-26 NOTE — ED Provider Notes (Signed)
Patient has been assessed after an hour-long nab treatment.  He is clear.  Talking in full sentences.  Able to walk to the bathroom without any distress.  He would be discharged him with a prescription for prednisone.  He has been provided with an inhaler and a spacer  Arman FilterGail K Seeley Hissong, NP 05/26/13 [phone]

## 2014-04-24 IMAGING — CR DG CHEST 2V
2 series · 2 of 2 positions shown · non-contrast
Comparison: None.

CLINICAL DATA: Shortness of breath, chest tightness

EXAM:
CHEST  2 VIEW

[w chest pa]
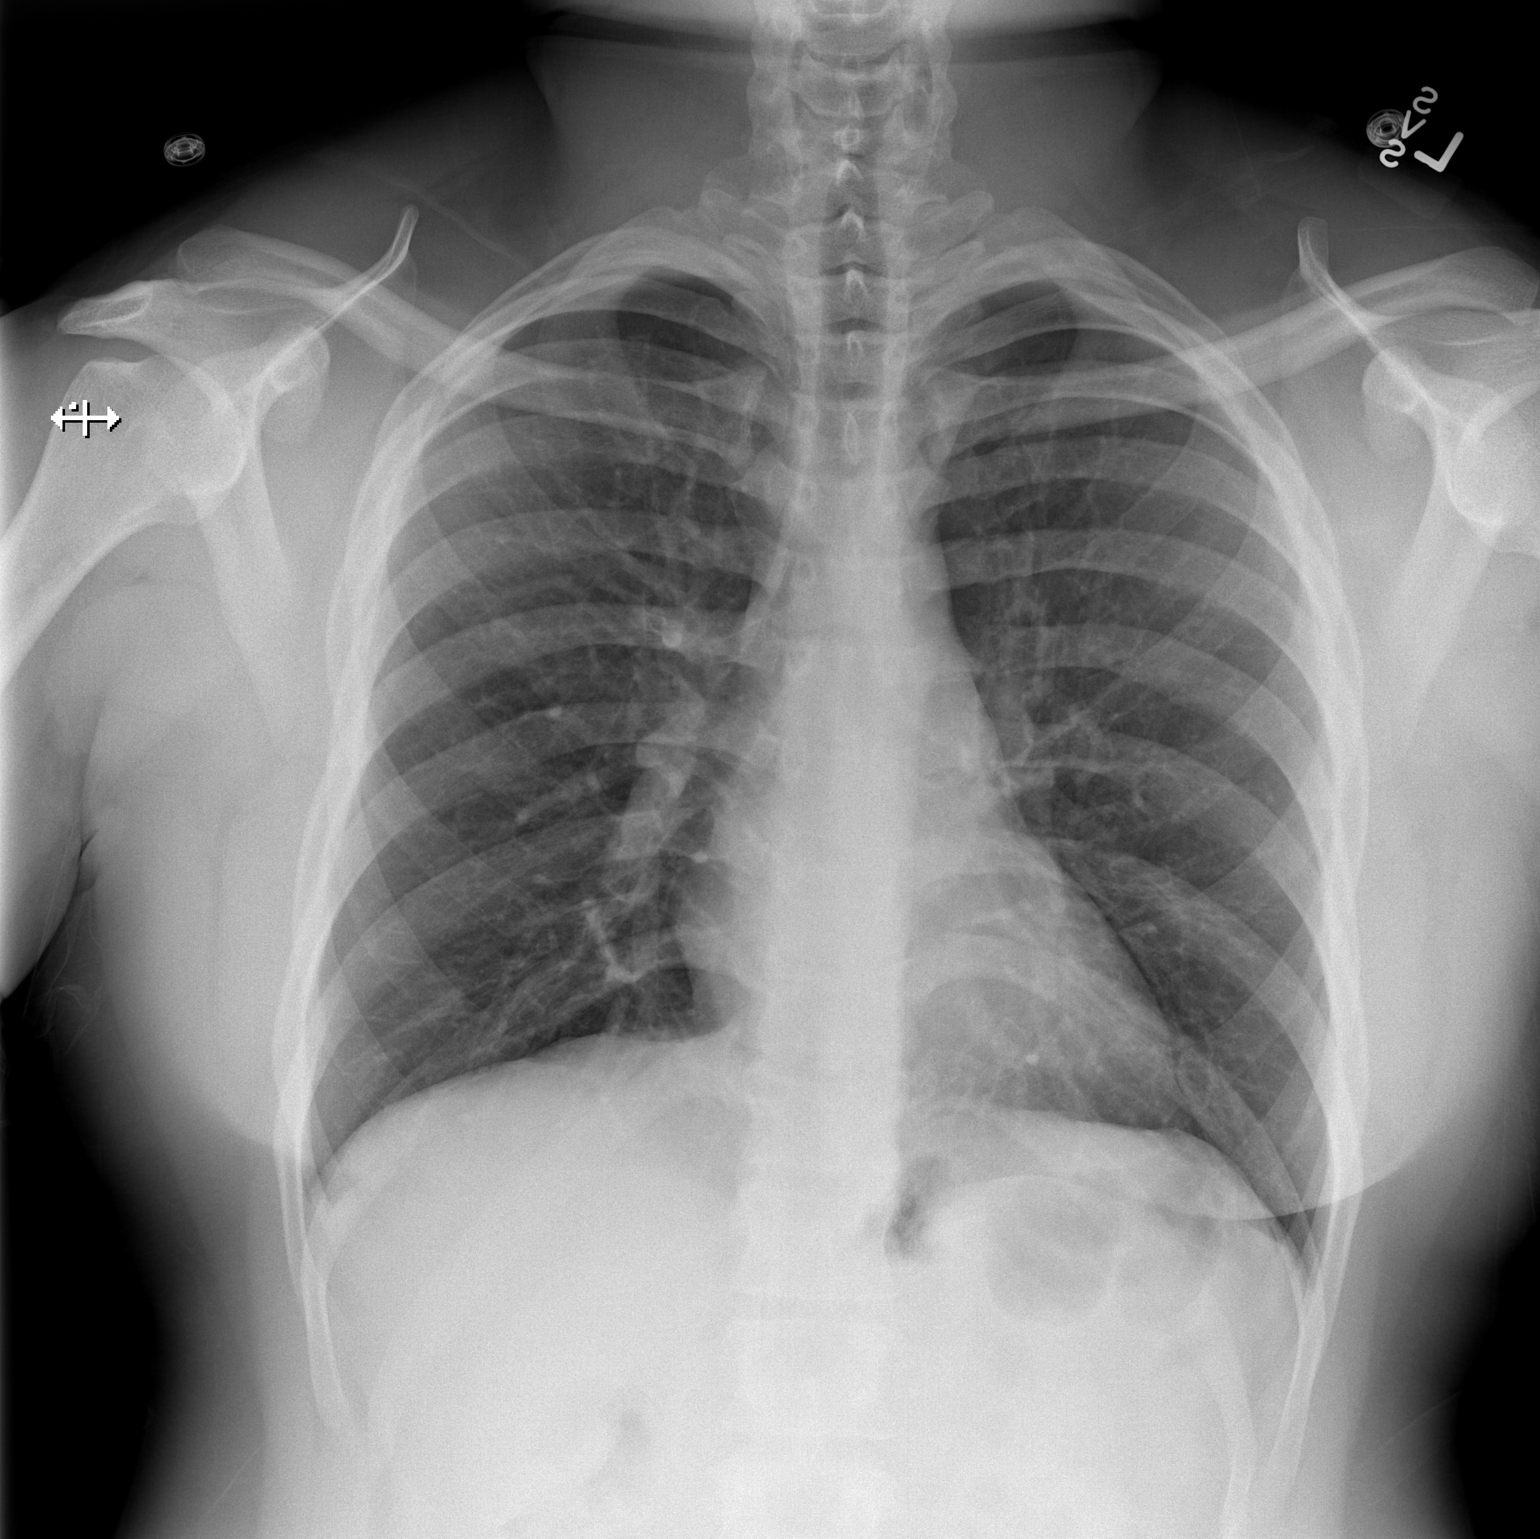

[w chest lat]
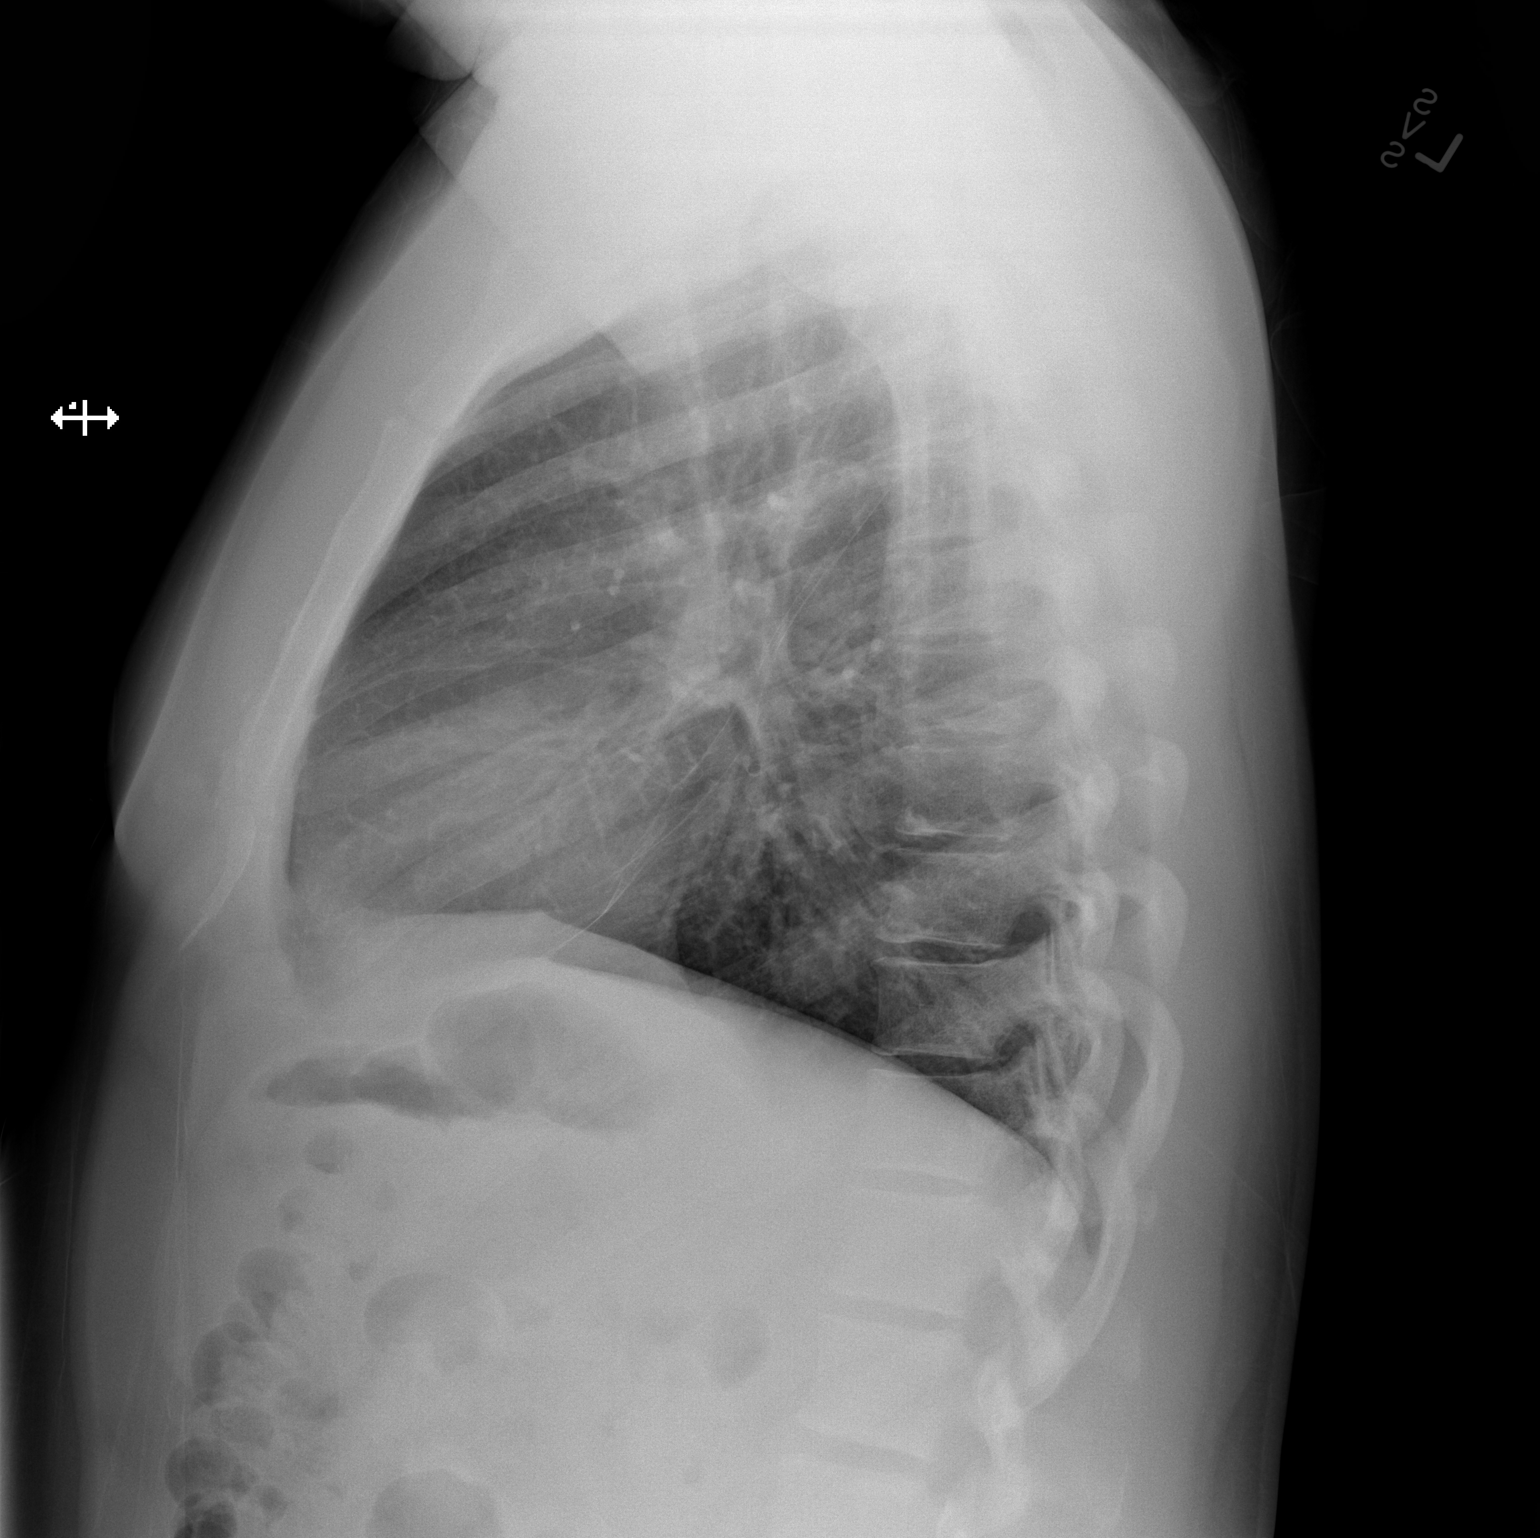

[2 of 2 positions shown; findings below may reference images not displayed]

FINDINGS: Normal cardiac silhouette and mediastinal contours. The lungs appear
mildly hyperexpanded with mild diffuse slightly nodular thickening
of the pulmonary interstitium. There is minimal pleural parenchymal
thickening above the bilateral major fissures. No focal airspace
opacities. No pleural effusion pneumothorax. No evidence of edema.
No acute osseus abnormalities.
IMPRESSION: Findings suggestive of airways disease / bronchitis. No focal
airspace opacities to suggest pneumonia.
